# Patient Record
Sex: Male | Born: 1965 | Race: White | Hispanic: No | Marital: Single | Smoking: Current every day smoker
Health system: Southern US, Community
[De-identification: ages and names within clinical notes are randomized; demographics above are authoritative.]

## PROBLEM LIST (undated history)

## (undated) HISTORY — PX: APPENDECTOMY: SHX54

## (undated) HISTORY — PX: OTHER SURGICAL HISTORY: SHX169

## (undated) HISTORY — PX: KNEE SURGERY: SHX244

---

## 2017-12-18 ENCOUNTER — Emergency Department (HOSPITAL_COMMUNITY)
Admission: EM | Admit: 2017-12-18 | Discharge: 2017-12-18 | Disposition: A | Discharge status: Home / Self Care | Payer: Worker's Compensation | Attending: Emergency Medicine | Admitting: Emergency Medicine

## 2017-12-18 ENCOUNTER — Encounter (HOSPITAL_COMMUNITY): Payer: Self-pay

## 2017-12-18 ENCOUNTER — Emergency Department (HOSPITAL_COMMUNITY): Payer: Worker's Compensation

## 2017-12-18 ENCOUNTER — Other Ambulatory Visit: Payer: Self-pay

## 2017-12-18 DIAGNOSIS — Y990 Civilian activity done for income or pay: Secondary | ICD-10-CM | POA: Diagnosis not present

## 2017-12-18 DIAGNOSIS — F17210 Nicotine dependence, cigarettes, uncomplicated: Secondary | ICD-10-CM | POA: Diagnosis not present

## 2017-12-18 DIAGNOSIS — S91342A Puncture wound with foreign body, left foot, initial encounter: Secondary | ICD-10-CM | POA: Insufficient documentation

## 2017-12-18 DIAGNOSIS — S91332A Puncture wound without foreign body, left foot, initial encounter: Secondary | ICD-10-CM

## 2017-12-18 DIAGNOSIS — W450XXA Nail entering through skin, initial encounter: Secondary | ICD-10-CM | POA: Insufficient documentation

## 2017-12-18 DIAGNOSIS — Y93H3 Activity, building and construction: Secondary | ICD-10-CM | POA: Diagnosis not present

## 2017-12-18 DIAGNOSIS — Y9261 Building [any] under construction as the place of occurrence of the external cause: Secondary | ICD-10-CM | POA: Diagnosis not present

## 2017-12-18 DIAGNOSIS — S99922A Unspecified injury of left foot, initial encounter: Secondary | ICD-10-CM | POA: Diagnosis present

## 2017-12-18 DIAGNOSIS — S90852A Superficial foreign body, left foot, initial encounter: Secondary | ICD-10-CM

## 2017-12-18 LAB — BASIC METABOLIC PANEL
ANION GAP: 12 (ref 5–15)
BUN: 22 mg/dL — ABNORMAL HIGH (ref 6–20)
CALCIUM: 9.6 mg/dL (ref 8.9–10.3)
CHLORIDE: 103 mmol/L (ref 98–111)
CO2: 24 mmol/L (ref 22–32)
CREATININE: 0.98 mg/dL (ref 0.61–1.24)
GFR calc Af Amer: >60 mL/min (ref >60)
GLUCOSE: 88 mg/dL (ref 70–99)
POTASSIUM: 3.5 mmol/L (ref 3.5–5.1)
Sodium: 139 mmol/L (ref 135–145)

## 2017-12-18 LAB — CBC WITH DIFFERENTIAL/PLATELET
BASOS ABS: 0 10*3/uL (ref 0.0–0.1)
BASOS PCT: 0 %
EOS ABS: 0.3 10*3/uL (ref 0.0–0.7)
Eosinophils Relative: 3 %
HEMATOCRIT: 42.6 % (ref 39.0–52.0)
Hemoglobin: 14.7 g/dL (ref 13.0–17.0)
Lymphocytes Relative: 26 %
Lymphs Abs: 2.5 10*3/uL (ref 0.7–4.0)
MCH: 31.9 pg (ref 26.0–34.0)
MCHC: 34.5 g/dL (ref 30.0–36.0)
MCV: 92.4 fL (ref 78.0–100.0)
MONO ABS: 1.1 10*3/uL — AB (ref 0.1–1.0)
Monocytes Relative: 11 %
NEUTROS ABS: 6 10*3/uL (ref 1.7–7.7)
Neutrophils Relative %: 60 %
PLATELETS: 183 10*3/uL (ref 150–400)
RBC: 4.61 MIL/uL (ref 4.22–5.81)
RDW: 12.8 % (ref 11.5–15.5)
WBC: 10 10*3/uL (ref 4.0–10.5)

## 2017-12-18 MED ORDER — HYDROMORPHONE HCL 1 MG/ML IJ SOLN
1.0000 mg | Freq: Once | INTRAMUSCULAR | Status: AC
  Administered 2017-12-18: 1 mg via INTRAVENOUS
  Filled 2017-12-18: qty 1

## 2017-12-18 MED ORDER — MORPHINE SULFATE (PF) 4 MG/ML IV SOLN
4.0000 mg | Freq: Once | INTRAVENOUS | Status: AC
Start: 2017-12-18 — End: 2017-12-18
  Administered 2017-12-18: 4 mg via INTRAVENOUS
  Filled 2017-12-18: qty 1

## 2017-12-18 MED ORDER — HYDROCODONE-ACETAMINOPHEN 5-325 MG PO TABS: 1.0000 | ORAL_TABLET | Freq: Four times a day (QID) | ORAL | 0 refills | Status: AC | PRN

## 2017-12-18 MED ORDER — CIPROFLOXACIN HCL 500 MG PO TABS: 500.0000 mg | ORAL_TABLET | Freq: Two times a day (BID) | ORAL | 0 refills | Status: AC

## 2017-12-18 MED ORDER — SODIUM CHLORIDE 0.9 % IV SOLN
2.0000 g | Freq: Once | INTRAVENOUS | Status: AC
  Administered 2017-12-18: 2 g via INTRAVENOUS
  Filled 2017-12-18: qty 2

## 2017-12-18 MED ORDER — LIDOCAINE-EPINEPHRINE (PF) 2 %-1:200000 IJ SOLN
10.0000 mL | Freq: Once | INTRAMUSCULAR | Status: AC
  Administered 2017-12-18: 10 mL
  Filled 2017-12-18: qty 20

## 2017-12-18 MED ORDER — AMOXICILLIN-POT CLAVULANATE 875-125 MG PO TABS: 1.0000 | ORAL_TABLET | Freq: Two times a day (BID) | ORAL | 0 refills | Status: AC

## 2017-12-18 MED ORDER — HYDROCODONE-ACETAMINOPHEN 5-325 MG PO TABS
1.0000 | ORAL_TABLET | Freq: Once | ORAL | Status: AC
  Administered 2017-12-18: 1 via ORAL
  Filled 2017-12-18: qty 1

## 2017-12-18 MED ORDER — FENTANYL CITRATE (PF) 100 MCG/2ML IJ SOLN
50.0000 ug | Freq: Once | INTRAMUSCULAR | Status: AC
Start: 2017-12-18 — End: 2017-12-18
  Administered 2017-12-18: 50 ug via INTRAMUSCULAR
  Filled 2017-12-18: qty 2

## 2017-12-18 MED ORDER — LIDOCAINE HCL (PF) 1 % IJ SOLN: 10.0000 mL | Freq: Once | INTRAMUSCULAR | Status: DC

## 2017-12-18 NOTE — ED Triage Notes (Signed)
Patient has a 3 inch nail in the right foot from a nail gun. Patient went to a [facility] and had x-rays completed. Patient brought CD's with him. No bleeding at this time. Pain 10/10.

## 2017-12-18 NOTE — Discharge Instructions (Addendum)
You were seen here today for a nail in your left foot. This was removed in the department I am starting on 2 different antibiotics. Please take all of your antibiotics until finished!   You may develop abdominal discomfort or diarrhea from the antibiotic.  You may help offset this with probiotics which you can buy or get in yogurt. Do not eat or take the probiotics until 2 hours after your antibiotic. Do not take your medicine if develop an itchy rash, swelling in your mouth or lips, or difficulty breathing. Please note that one of the antibiotics I am starting you on, ciprofloxacin, can lead to increased risk of tendon injury/rupture.  Please avoid high impact activities while taking this medication for several weeks after finishing the medication.  Please take Norco as needed for pain.  Please note this medication is a narcotic.  Please do not drive while taking his medication.  He will have to follow-up with orthopedics for further refills of his medication.  He may become constipated from this medication.  He can try stool softeners and MiraLAX over-the-counter to help with this.  Please remain non-weightbearing by using crutches until you are able to follow up with orthopedics. I would like you to call tomorrow morning to his office to schedule a follow up appointment.   Your postop x-ray showed: Complete removal of metallic nail from the left foot. Probable focal cortical destruction along the lateral aspect of the base of the first proximal phalanx and slight lucent defect in the head of the second metatarsal related to the course of the nail.  Return for re-evaluation if you develop You have a fever. You notice a bad smell coming from your wound or your dressing. You have increased redness, swelling, or pain at the site of your wound. You have fluid, blood, or pus coming from your wound. You notice a change in the color of your skin near your wound. You need to change the dressing frequently  due to fluid, blood, or pus draining from your wound. You develop a new rash. You develop numbness around or distal to your wound. You develop severe swelling around your wound. Your pain suddenly increases and is severe. You develop painful skin lumps. You have a red streak going away from your wound. The wound is on your hand or foot and you cannot properly move a finger or toe. The wound is on your hand or foot and you notice that your fingers or toes look pale or bluish.

## 2017-12-18 NOTE — ED Provider Notes (Signed)
[facility]-EMERGENCY DEPT Provider Note   CSN: [id number] Arrival date & time: 12/18/17  1615     History   Chief Complaint Chief Complaint  Patient presents with  . nail in the foot    HPI Dylan Wheeler is a 52 y.o. male with no significant past medical history who presents emergency department today for injury to the left foot.  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  [facility] and was told he had a "chipped bone".  His sock and shoe removed on arrival and it appears that the nail enters at an angle through the first MCP and exits on the posterior forefoot adjacent to the fourth digit as seen in pictures below.  He notes he does have some numbing of the first digit.  He notes movement of the toes because of extreme pain. He rates his pain as 10/10  & describes as constant throbbing pain. He denies any pain medication prior to arrival.  His last p.o. intake was approximately 12 PM today.  His last tetanus shot was 2 years ago.  HPI  History reviewed. No pertinent past medical history.  There are no active problems to display for this patient.   Past Surgical History:  Procedure Laterality Date  . APPENDECTOMY    . cystectomy    . KNEE SURGERY          Home Medications    Prior to Admission medications   Not on File    Family History History reviewed. No pertinent family history.  Social History Social History   Tobacco Use  . Smoking status: Current Every Day Smoker    Packs/day: 1.00    Types: Cigarettes  . Smokeless tobacco: Never Used  Substance Use Topics  . Alcohol use: Yes    Comment: occasionally  . Drug use: Never     Allergies   Patient has no known allergies.   Review of Systems Review of Systems  All other systems reviewed and are negative.    Physical  Exam Updated Vital Signs BP (!) 138/94   Pulse 67   Temp 97.8 F (36.6 C)   Resp 16   Ht 6\' 5"  (1.956 m)   Wt 102.1 kg (225 lb)   SpO2 100%   BMI 26.68 kg/m   Physical Exam  Constitutional: He appears well-developed and well-nourished.  HENT:  Head: Normocephalic and atraumatic.  Right Ear: External ear normal.  Left Ear: External ear normal.  Nose: Nose normal.  Mouth/Throat: Uvula is midline, oropharynx is clear and moist and mucous membranes are normal. No tonsillar exudate.  Eyes: Pupils are equal, round, and reactive to light. Right eye exhibits no discharge. Left eye exhibits no discharge. No scleral icterus.  Neck: Trachea normal. Neck supple. No spinous process tenderness present. No neck rigidity. Normal range of motion present.  Cardiovascular: Normal rate, regular rhythm and intact distal pulses.  No murmur heard. Pulses:      Radial pulses are 2+ on the right side, and 2+ on the left side.       Dorsalis pedis pulses are 2+ on the right side, and 2+ on the left side.       Posterior tibial pulses are 2+ on the right side, and 2+ on the left side.  Pulmonary/Chest: Effort  normal and breath sounds normal. He exhibits no tenderness.  Abdominal: Soft. Bowel sounds are normal. There is no tenderness. There is no rebound and no guarding.  Musculoskeletal: He exhibits no edema.       Left ankle: Normal. Achilles tendon normal.  There is noted to be an entry wound of the nail through the 1st MCP at an angle with exit wound on the forefoot of the dorsal aspect at the level of the 3rd and 4th digits as seen in the picture below. There is decreased rom of the 1st - 4th digits 2/2 pain. Cap refill 2 seconds.   Lymphadenopathy:    He has no cervical adenopathy.  Neurological: He is alert.  Decreased sensation to light touch of the 1st digit of the left foot  Skin: Skin is warm and dry. No rash noted. He is not diaphoretic.  Psychiatric: He has a normal mood and affect.  Nursing  note and vitals reviewed.        ED Treatments / Results  Labs (all labs ordered are listed, but only abnormal results are displayed) Labs Reviewed  BASIC METABOLIC PANEL - Abnormal; Notable for the following components:      Result Value   BUN 22 (*)    All other components within normal limits  CBC WITH DIFFERENTIAL/PLATELET - Abnormal; Notable for the following components:   Monocytes Absolute 1.1 (*)    All other components within normal limits    EKG None  Radiology Dg Foot Complete Left  Result Date: 12/18/2017 CLINICAL DATA:  Status post removal male from left foot. EXAM: LEFT FOOT - COMPLETE 3+ VIEW COMPARISON:  None. FINDINGS: The metallic nail has been completely removed. There is mild cortical destruction along the lateral aspect of the base of the first proximal phalanx where the foreign body likely traversed. There also is a slight lucent defect in the head of the second metatarsal also potentially representing partial bony injury or indentation. The metatarsal cortex in this region does appear to be completely intact, however with no obvious break. No other bony abnormalities. IMPRESSION: Complete removal of metallic nail from the left foot. Probable focal cortical destruction along the lateral aspect of the base of the first proximal phalanx and slight lucent defect in the head of the second metatarsal related to the course of the nail. Electronically Signed   By: Irish Lack M.D.   On: 12/18/2017 21:05   Dg Foot Complete Left  Result Date: 12/18/2017 CLINICAL DATA:  Nail gun injury EXAM: LEFT FOOT - COMPLETE 3+ VIEW COMPARISON:  None. FINDINGS: Metal nasal is present overlying the first second and third and fourth metatarsophalangeal joints. This goes through the first proximal phalanx and probably into the soft tissues inferior to the second third and fourth MTP joint. No displaced fracture fragments. Mild bunion and degenerative change first metatarsophalangeal  joint. IMPRESSION: Metal nail approximately 6 cm long appears to be passing through the proximal first phalanx and extending into the soft tissues below the second third and fourth MTP. Electronically Signed   By: Marlan Palau M.D.   On: 12/18/2017 18:37   Urgent care films        Procedures .Foreign Body Removal Date/Time: 12/18/2017 9:11 PM Performed by: Jacinto Halim, PA-C Authorized by: Jacinto Halim, PA-C  Consent: Verbal consent obtained. Risks and benefits: risks, benefits and alternatives were discussed Consent given by: patient Patient understanding: patient states understanding of the procedure being performed Patient consent: the patient's understanding  of the procedure matches consent given Relevant documents: relevant documents present and verified Test results: test results available and properly labeled Site marked: the operative site was marked Imaging studies: imaging studies available Patient identity confirmed: verbally with patient and arm band Time out: Immediately prior to procedure a "time out" was called to verify the correct patient, procedure, equipment, support staff and site/side marked as required. Intake: left foot. Anesthesia: local infiltration  Anesthesia: Local Anesthetic: lidocaine 2% with epinephrine Anesthetic total: 15 mL  Sedation: Patient sedated: no  Patient restrained: no Complexity: complex 1 objects recovered. Objects recovered: nail Post-procedure assessment: foreign body removed Patient tolerance: Patient tolerated the procedure well with no immediate complications   (including critical care time) SPLINT APPLICATION Date/Time: 10:03 PM Authorized by: Jacinto HalimMichael M Starasia Sinko Consent: Verbal consent obtained. Risks and benefits: risks, benefits and alternatives were discussed Consent given by: patient Splint applied by: orthopedic technician Location details: left foot Splint type: post op shoe Supplies used: post op  shoe Post-procedure: The splinted body part was neurovascularly unchanged following the procedure. Patient tolerance: Patient tolerated the procedure well with no immediate complications.    Medications Ordered in ED Medications  fentaNYL (SUBLIMAZE) injection 50 mcg (50 mcg Intramuscular Given 12/18/17 1730)  ceFEPIme (MAXIPIME) 2 g in sodium chloride 0.9 % 100 mL IVPB (0 g Intravenous Stopped 12/18/17 1847)  morphine 4 MG/ML injection 4 mg (4 mg Intravenous Given 12/18/17 1803)  HYDROmorphone (DILAUDID) injection 1 mg (1 mg Intravenous Given 12/18/17 1853)  lidocaine-EPINEPHrine (XYLOCAINE W/EPI) 2 %-1:200000 (PF) injection 10 mL (10 mLs Other Given 12/18/17 1930)  HYDROcodone-acetaminophen (NORCO/VICODIN) 5-325 MG per tablet 1 tablet (1 tablet Oral Given 12/18/17 2147)     Initial Impression / Assessment and Plan / ED Course  I have reviewed the triage vital signs and the nursing notes.  Pertinent labs & imaging results that were available during my care of the patient were reviewed by me and considered in my medical decision making (see chart for details).  Clinical Course as of Dec 18 2198  Tue Dec 18, 2017  [id number] 52 year old male had a nail go through his shoe and sock into his foot from a nail gun.  He was seen at urgent care referred him here.  His x-rays are likely some cortical disruption of the bone and his first and second metatarsal.  Ortho was consulted and they recommended removing the foreign body placing the patient on antibiotics and following up.  Patient had a foreign body removed under local after some IV pain medicine and repeat x-rays look like it is been removed intact.  He has been placed in a walking shoe and antibiotics and follow-up with a good understanding for what to watch out for.   [MB]    Clinical Course User Index [MB] Terrilee FilesButler, Joshuan Bolander C, MD   52 year old male presenting with foreign body, nail, in the left foot as above.  Patient was seen in urgent care and  referred here.  X-rays as above.  Case was discussed with orthopedics, Dr. Roda ShuttersXu, who recommended removal in the department.  Patient's case was discussed with Kenard Gowerrew form pharmacy who recommended cefepime in the department and discharged on amoxicillin and ciprofloxacin.  Foreign body was removed by myself and thorough irrigation with 2.5 L of pressure irrigation was performed afterwards.  The wound was bandaged with Xeroform dressing as well as a bulky dressing.  Postop x-rays show cortical destruction along the lateral aspect of the first and second metatarsals.  Patient  will be placed in postop shoe and given crutches.  He is to remain nonweightbearing until follow-up with orthopedics.  Patient was advised he is being given ciprofloxacin in the status post risk for tendon injury.  He was given a short course of pain medication.  The patient was reviewed in the [place] controlled substance database and no discrepancies were found.  Patient was advised strict return precautions.  Patient states understanding and pain is currently controlled.  He appears safe for discharge.  Final Clinical Impressions(s) / ED Diagnoses   Final diagnoses:  Foreign body in left foot, initial encounter  Puncture wound of left foot, initial encounter    ED Discharge Orders        Ordered    ciprofloxacin (CIPRO) 500 MG tablet  2 times daily     12/18/17 2129    amoxicillin-clavulanate (AUGMENTIN) 875-125 MG tablet  Every 12 hours     12/18/17 2129    HYDROcodone-acetaminophen (NORCO) 5-325 MG tablet  Every 6 hours PRN     12/18/17 2129       Princella Pellegrini 12/18/17 2204    Terrilee Files, MD 12/19/17 1438

## 2017-12-25 ENCOUNTER — Ambulatory Visit (INDEPENDENT_AMBULATORY_CARE_PROVIDER_SITE_OTHER): Payer: Self-pay | Admitting: Orthopaedic Surgery

## 2017-12-25 DIAGNOSIS — S90852A Superficial foreign body, left foot, initial encounter: Secondary | ICD-10-CM | POA: Insufficient documentation

## 2017-12-25 MED ORDER — AMOXICILLIN-POT CLAVULANATE 875-125 MG PO TABS: 1.0000 | ORAL_TABLET | Freq: Two times a day (BID) | ORAL | 0 refills | Status: AC

## 2017-12-25 MED ORDER — CIPROFLOXACIN HCL 500 MG PO TABS: 500.0000 mg | ORAL_TABLET | Freq: Two times a day (BID) | ORAL | 0 refills | Status: AC

## 2017-12-25 NOTE — Progress Notes (Signed)
Office Visit Note   Patient: Dylan Wheeler           Date of Birth: 1965/08/07           MRN: [id number] Visit Date: 12/25/2017              Requested by: No referring provider defined for this encounter. PCP: Patient, No Pcp Per   Assessment & Plan: Visit Diagnoses:  1. Foreign body in left foot, initial encounter     Plan: Impression is status post left nail gun injury to the left foot status post removal by the ED.  Clinically speaking he seems to be doing okay and does not exhibit any signs of deep infection.  I am going to extend his antibiotics for another week.  Recheck at that time.  Patient instructed to return sooner if he develops any signs or symptoms of deep infection.  Follow-Up Instructions: Return in about 1 week (around 01/01/2018).   Orders:  No orders of the defined types were placed in this encounter.  Meds ordered this encounter  Medications  . ciprofloxacin (CIPRO) 500 MG tablet    Sig: Take 1 tablet (500 mg total) by mouth 2 (two) times daily.    Dispense:  14 tablet    Refill:  0  . amoxicillin-clavulanate (AUGMENTIN) 875-125 MG tablet    Sig: Take 1 tablet by mouth 2 (two) times daily.    Dispense:  14 tablet    Refill:  0      Procedures: No procedures performed   Clinical Data: No additional findings.   Subjective: Chief Complaint  Patient presents with  . Left Foot - Wound Check    Puncture wound  12/18/17    Dylan Wheeler is a healthy 52 year old gentleman who has sustained a nail gun injury to his left foot a week ago.  This was removed in the ED.  He has been on a week of Augmentin and Cipro.  He denies any constitutional symptoms.  Denies any significant pain.   Review of Systems  Constitutional: Negative.   All other systems reviewed and are negative.    Objective: Vital Signs: There were no vitals taken for this visit.  Physical Exam  Constitutional: He is oriented to person, place, and time. He appears well-developed and  well-nourished.  HENT:  Head: Normocephalic and atraumatic.  Eyes: Pupils are equal, round, and reactive to light.  Neck: Neck supple.  Pulmonary/Chest: Effort normal.  Abdominal: Soft.  Musculoskeletal: Normal range of motion.  Neurological: He is alert and oriented to person, place, and time.  Skin: Skin is warm.  Psychiatric: He has a normal mood and affect. His behavior is normal. Judgment and thought content normal.  Nursing note and vitals reviewed.   Ortho Exam Left foot exam shows mild to moderate swelling without any evidence of ascending cellulitis.  There is some mild tenderness palpation around the foot near the puncture sites.  There is no drainage.  No neurovascular compromise. Specialty Comments:  No specialty comments available.  Imaging: No results found.   PMFS History: Patient Active Problem List   Diagnosis Date Noted  . Foreign body in left foot 12/25/2017   No past medical history on file.  No family history on file.  Past Surgical History:  Procedure Laterality Date  . APPENDECTOMY    . cystectomy    . KNEE SURGERY     Social History   Occupational History  . Not on file  Tobacco Use  .  Smoking status: Current Every Day Smoker    Packs/day: 1.00    Types: Cigarettes  . Smokeless tobacco: Never Used  Substance and Sexual Activity  . Alcohol use: Yes    Comment: occasionally  . Drug use: Never  . Sexual activity: Not on file

## 2018-01-01 ENCOUNTER — Ambulatory Visit (INDEPENDENT_AMBULATORY_CARE_PROVIDER_SITE_OTHER): Payer: Self-pay | Admitting: Orthopaedic Surgery

## 2018-01-15 ENCOUNTER — Ambulatory Visit (INDEPENDENT_AMBULATORY_CARE_PROVIDER_SITE_OTHER): Payer: Self-pay | Admitting: Orthopaedic Surgery

## 2018-01-15 ENCOUNTER — Encounter (INDEPENDENT_AMBULATORY_CARE_PROVIDER_SITE_OTHER): Payer: Self-pay | Admitting: Orthopaedic Surgery

## 2018-01-15 ENCOUNTER — Ambulatory Visit (INDEPENDENT_AMBULATORY_CARE_PROVIDER_SITE_OTHER): Payer: Self-pay

## 2018-01-15 DIAGNOSIS — M79672 Pain in left foot: Secondary | ICD-10-CM

## 2018-01-15 NOTE — Progress Notes (Signed)
   Office Visit Note   Patient: Dylan Wheeler           Date of Birth: 27-Feb-1966           MRN: [id number] Visit Date: 01/15/2018              Requested by: No referring provider defined for this encounter. PCP: Dylan Wheeler   Assessment & Plan: Visit Diagnoses:  1. Pain in left foot     Plan: Impression is left foot healed wound but concerning for deep infection given the fact that patient did not take the antibiotics that we prescribed him.  The patient has been working and continues to have significant swelling to the left foot.  Will order MRI asap to assess for abscess.   Follow-Up Instructions: Return in about 4 weeks (around 02/12/2018).   Orders:  Orders Placed This Encounter  Procedures  . XR Foot Complete Left  . MR FOOT LEFT W CONTRAST  . Arthrogram   No orders of the defined types were placed in this encounter.     Procedures: No procedures performed   Clinical Data: No additional findings.   Subjective: Chief Complaint  Patient presents with  . Left Foot - Pain    HPI patient is a pleasant 52 year old gentleman who presents to our clinic today for follow-up of his left foot.  He sustained a nail gun injury while at work on 12/18/2017.  This was removed in the ED.  He was initially on Augmentin and Cipro prior to his follow-up with us.  He was seen in our office on 12/25/2017.  We extended his antibiotics for an additional week, but the patient refused to take these.  He has been working in [employer] over the past few weeks but noticed an increase in pain, swelling and deformity to the second and third toes at the end of last week.  No new injury.  Denies any constitutional symptoms.  Review of Systems as detailed in HPI.  All others reviewed and are negative.   Objective: Vital Signs: There were no vitals taken for this visit.  Physical Exam well-developed well-nourished gentleman in no acute distress.  Alert and oriented x3.  Ortho Exam  examination of the left foot reveals marked swelling throughout.  Marked tenderness to the second and third metatarsal heads.  He does have slightly limited range of motion.  Slight erythema to the dorsum of the foot.   Specialty Comments:  No specialty comments available.  Imaging: Xr Foot Complete Left  Result Date: 01/15/2018 No acute or structural abnormalities    PMFS History: Patient Active Problem List   Diagnosis Date Noted  . Pain in left foot 01/15/2018  . Foreign body in left foot 12/25/2017   History reviewed. No pertinent past medical history.  History reviewed. No pertinent family history.  Past Surgical History:  Procedure Laterality Date  . APPENDECTOMY    . cystectomy    . KNEE SURGERY     Social History   Occupational History  . Not on file  Tobacco Use  . Smoking status: Current Every Day Smoker    Packs/day: 1.00    Types: Cigarettes  . Smokeless tobacco: Never Used  Substance and Sexual Activity  . Alcohol use: Yes    Comment: occasionally  . Drug use: Never  . Sexual activity: Not on file

## 2018-01-28 ENCOUNTER — Telehealth (INDEPENDENT_AMBULATORY_CARE_PROVIDER_SITE_OTHER): Payer: Self-pay | Admitting: Orthopaedic Surgery

## 2018-01-28 NOTE — Telephone Encounter (Signed)
Ok to do this? Not sure why they want both.

## 2018-01-28 NOTE — Telephone Encounter (Signed)
yes

## 2018-01-28 NOTE — Telephone Encounter (Signed)
Rhonda from [facility] called stating that she needs the Epic order for the patient's MRI to be change to with and without contrast.  CB#[phone].  Thank you.

## 2018-01-29 NOTE — Telephone Encounter (Signed)
DONE ALREADY

## 2018-01-30 ENCOUNTER — Ambulatory Visit
Admission: RE | Admit: 2018-01-30 | Discharge: 2018-01-30 | Disposition: A | Discharge status: Home / Self Care | Payer: No Typology Code available for payment source | Source: Ambulatory Visit | Attending: Orthopaedic Surgery | Admitting: Orthopaedic Surgery

## 2018-01-30 ENCOUNTER — Other Ambulatory Visit: Payer: Self-pay

## 2018-01-30 DIAGNOSIS — M79672 Pain in left foot: Secondary | ICD-10-CM

## 2018-01-30 MED ORDER — GADOBENATE DIMEGLUMINE 529 MG/ML IV SOLN
20.0000 mL | Freq: Once | INTRAVENOUS | Status: AC | PRN
  Administered 2018-01-30: 20 mL via INTRAVENOUS

## 2018-01-31 NOTE — Progress Notes (Signed)
MRI is negative for infection.  Looks like it's swelling and edema from fracture and not elevating his foot.

## 2018-02-12 ENCOUNTER — Ambulatory Visit (INDEPENDENT_AMBULATORY_CARE_PROVIDER_SITE_OTHER): Payer: Self-pay | Admitting: Orthopaedic Surgery

## 2018-02-12 DIAGNOSIS — M79672 Pain in left foot: Secondary | ICD-10-CM

## 2018-02-12 NOTE — Progress Notes (Signed)
   Office Visit Note   Patient: Dylan Wheeler           Date of Birth: 11/11/1965           MRN: [id number] Visit Date: 02/12/2018              Requested by: No referring provider defined for this encounter. PCP: Patient, No Pcp Per   Assessment & Plan: Visit Diagnoses:  1. Pain in left foot     Plan: MRI is negative for infection.  He does have healing fractures which does explain his continued swelling and discomfort especially since he is on his feet all day.  These findings are overall reassuring.  He is to increase activity as tolerated.  Rest ice and elevate as needed.  Follow-up as needed.  Follow-Up Instructions: Return if symptoms worsen or fail to improve.   Orders:  No orders of the defined types were placed in this encounter.  No orders of the defined types were placed in this encounter.     Procedures: No procedures performed   Clinical Data: No additional findings.   Subjective: Chief Complaint  Patient presents with  . Left Foot - Pain    Khadar follows up today for MRI review.  He denies any constitutional symptoms.  He still has swelling but this is significantly improved compared to our last visit.   Review of Systems   Objective: Vital Signs: There were no vitals taken for this visit.  Physical Exam  Ortho Exam Left foot exam shows no cellulitis or evidence of infection.  No neurovascular compromise.  He does have some persistent mild to moderate swelling which appears to be symmetric to the other side. Specialty Comments:  No specialty comments available.  Imaging: No results found.   PMFS History: Patient Active Problem List   Diagnosis Date Noted  . Pain in left foot 01/15/2018  . Foreign body in left foot 12/25/2017   No past medical history on file.  No family history on file.  Past Surgical History:  Procedure Laterality Date  . APPENDECTOMY    . cystectomy    . KNEE SURGERY     Social History   Occupational History    . Not on file  Tobacco Use  . Smoking status: Current Every Day Smoker    Packs/day: 1.00    Types: Cigarettes  . Smokeless tobacco: Never Used  Substance and Sexual Activity  . Alcohol use: Yes    Comment: occasionally  . Drug use: Never  . Sexual activity: Not on file

## 2020-03-30 IMAGING — CR DG FOOT COMPLETE 3+V*L*
3 series · 3 of 3 positions shown · non-contrast
Comparison: None.

CLINICAL DATA: Status post removal male from left foot.

EXAM:
LEFT FOOT - COMPLETE 3+ VIEW

[x foot ap left]
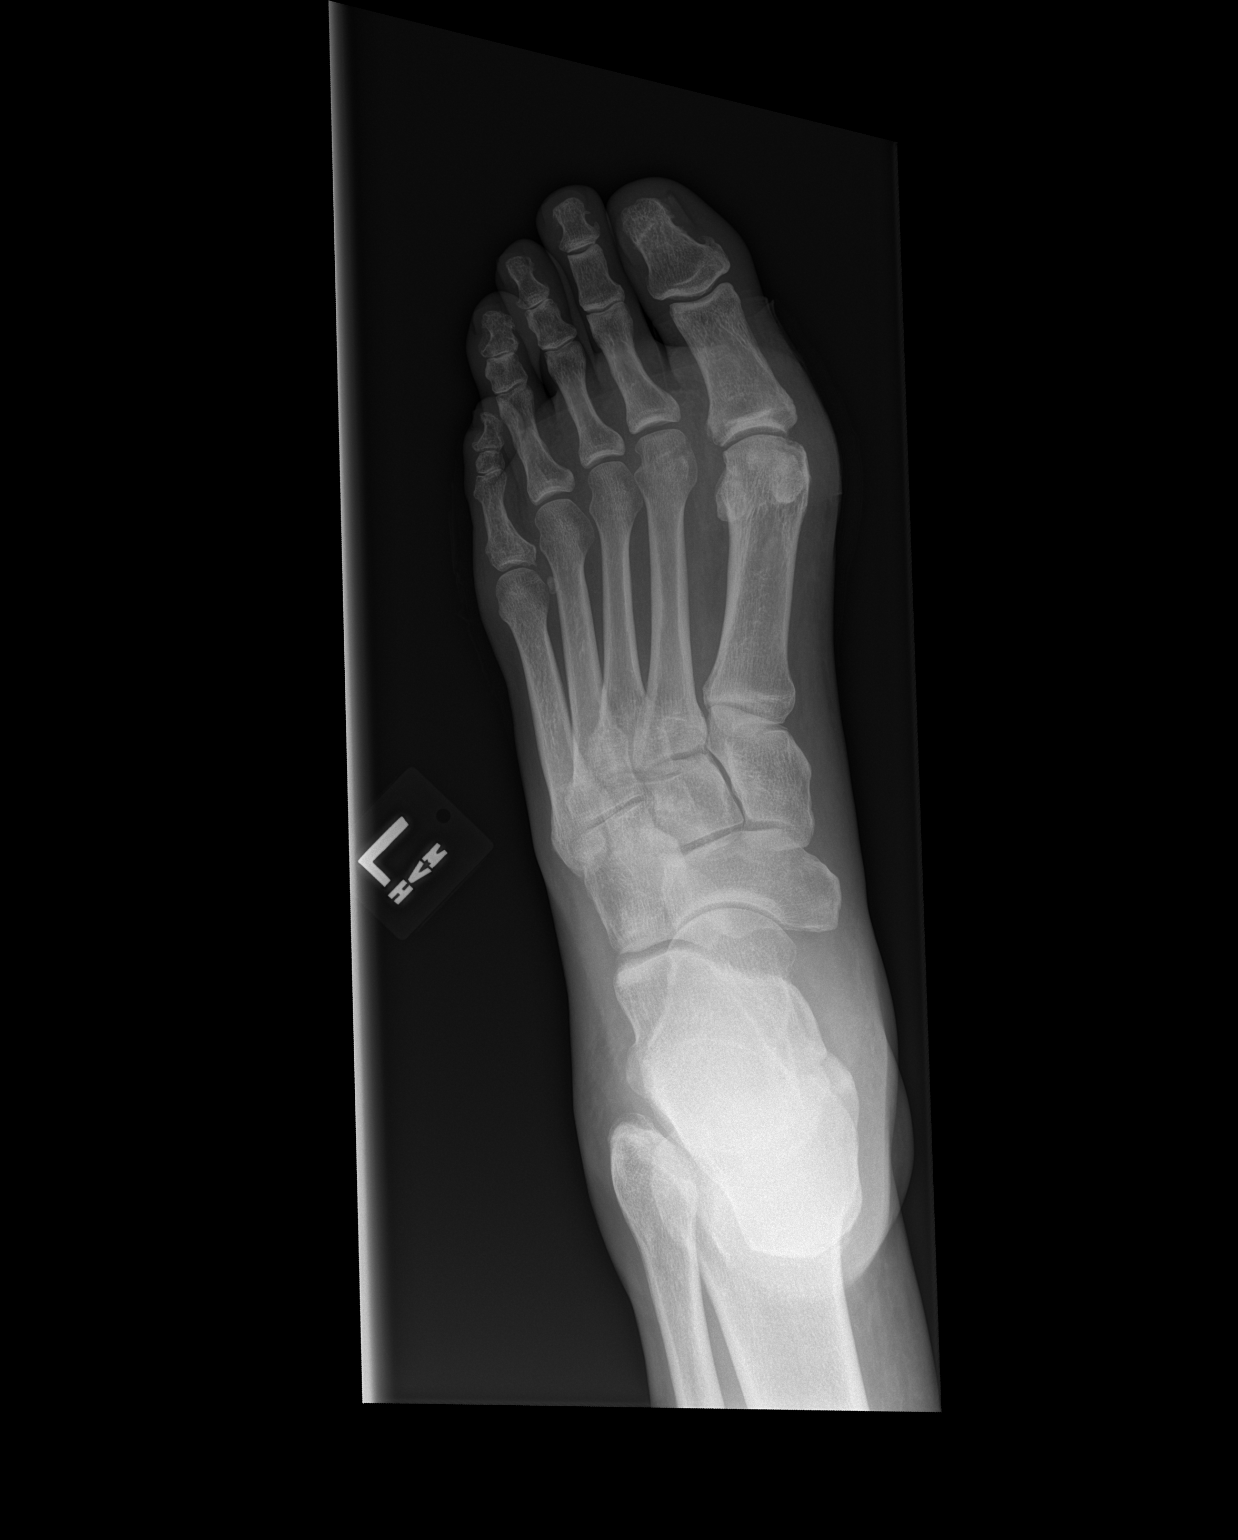

[x foot obl left]
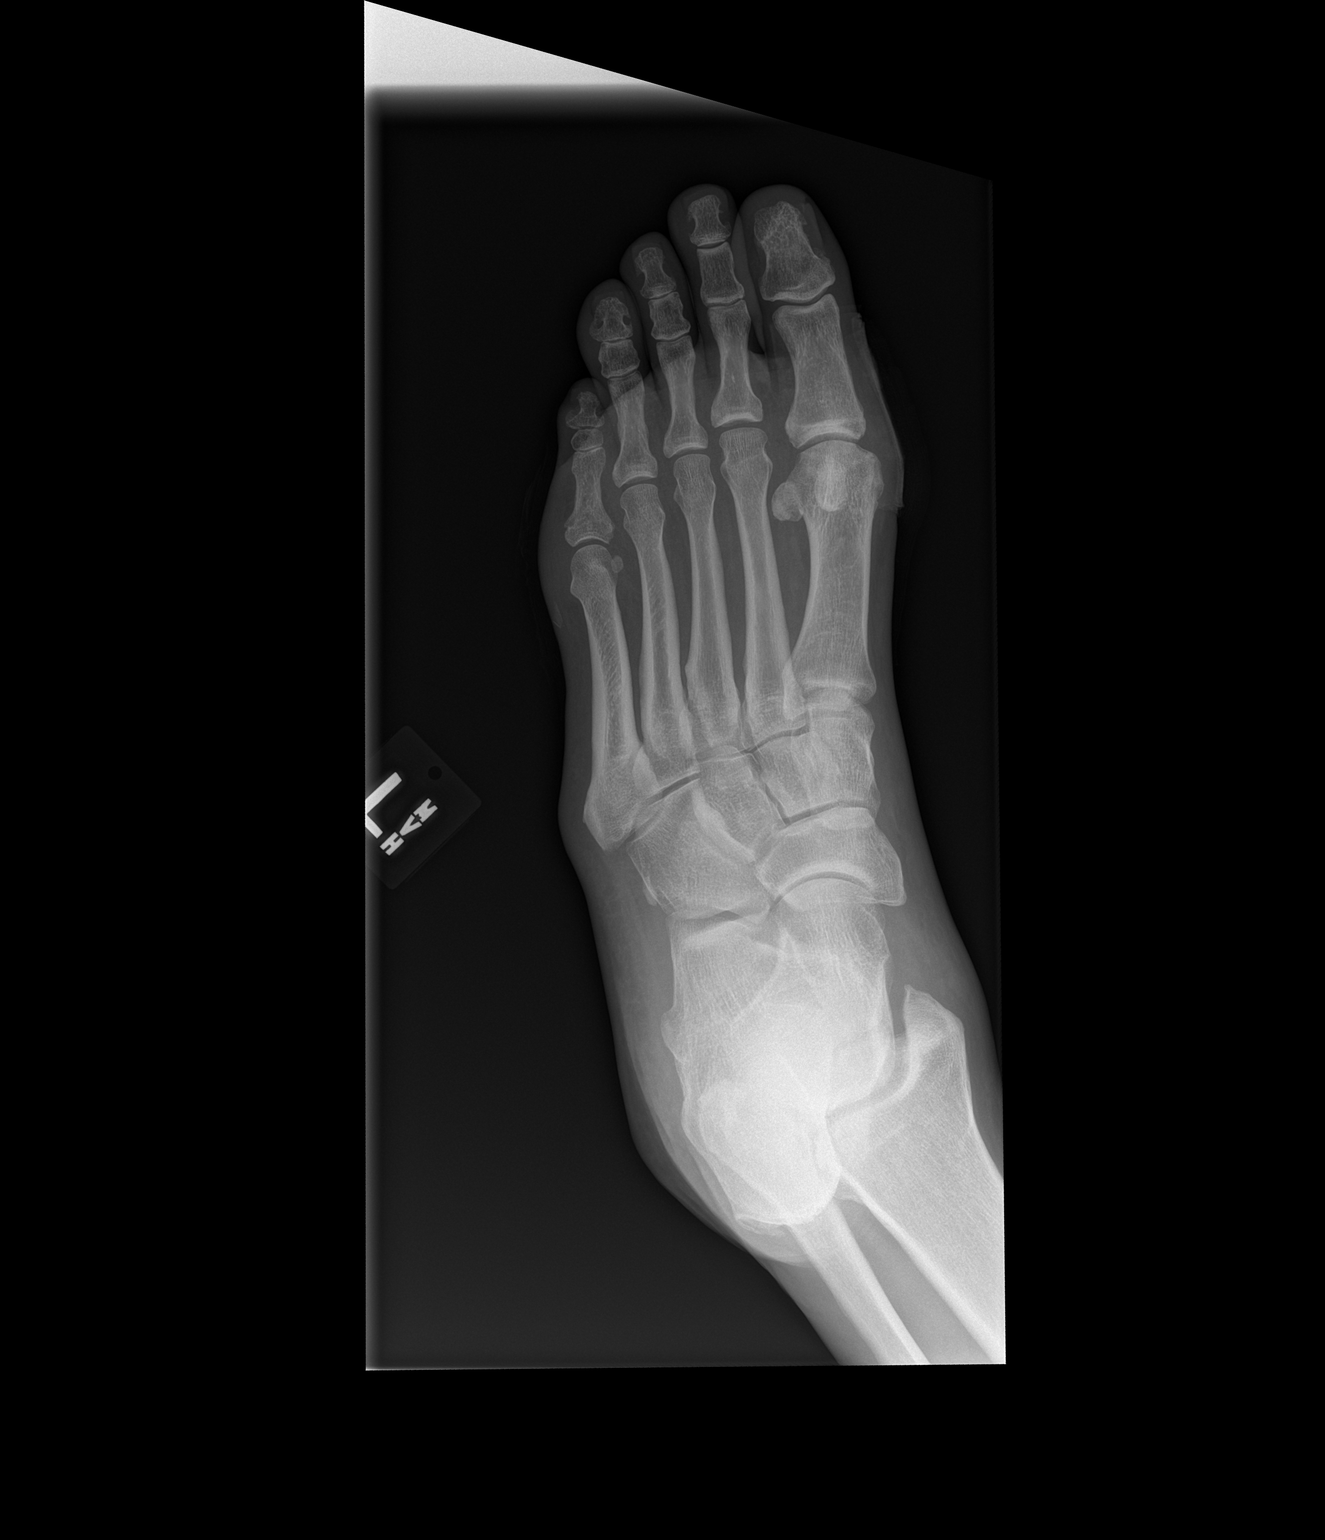

[x foot lat left]
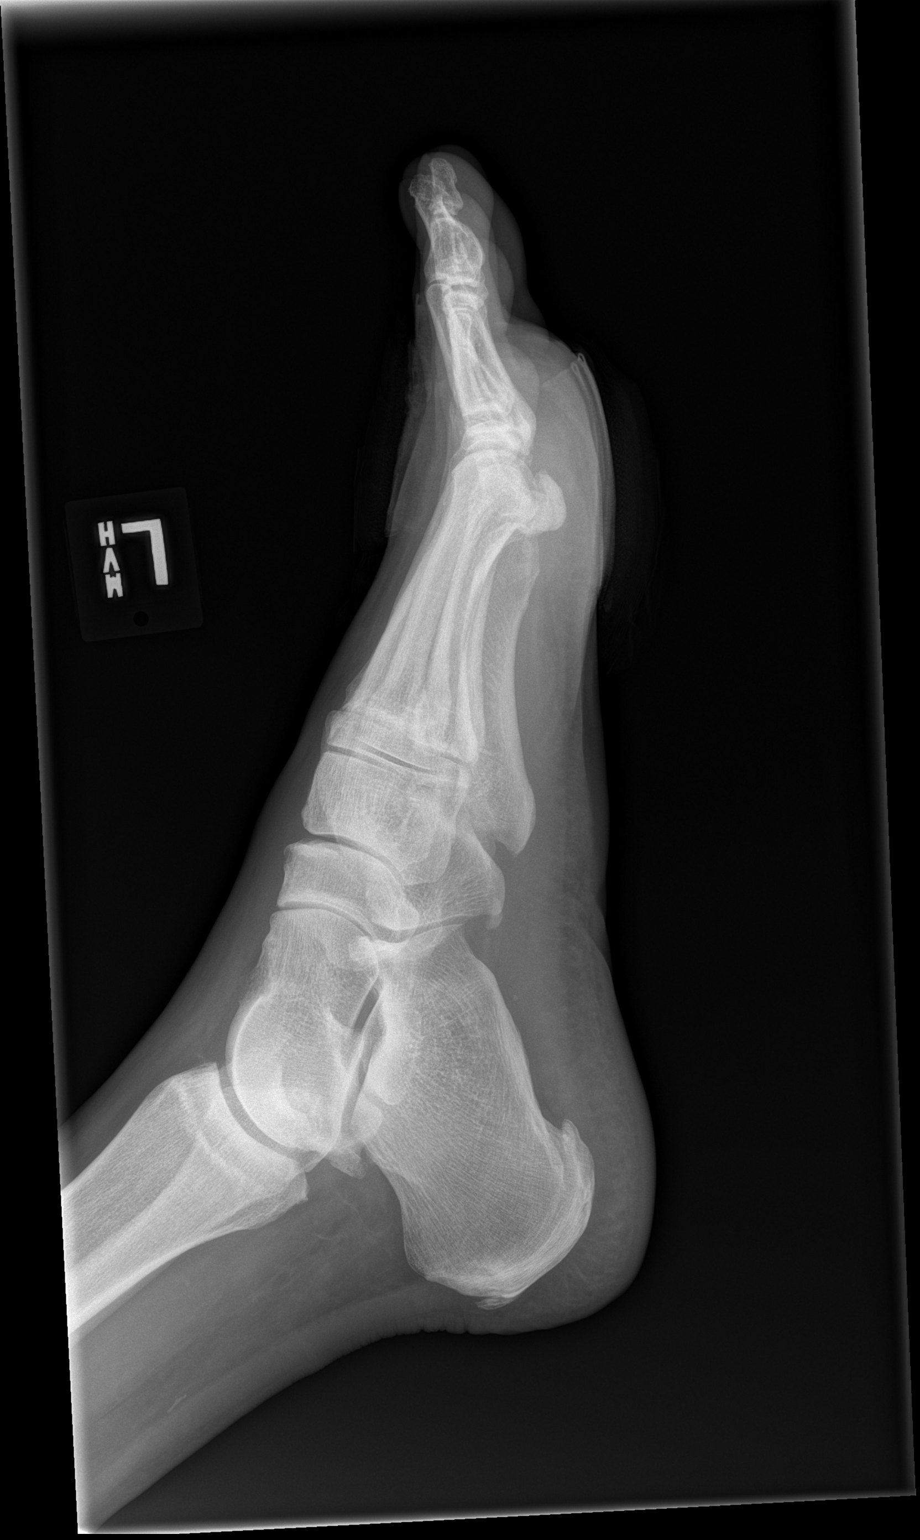

[3 of 3 positions shown; findings below may reference images not displayed]

FINDINGS: The metallic nail has been completely removed. There is mild
cortical destruction along the lateral aspect of the base of the
first proximal phalanx where the foreign body likely traversed.
There also is a slight lucent defect in the head of the second
metatarsal also potentially representing partial bony injury or
indentation. The metatarsal cortex in this region does appear to be
completely intact, however with no obvious break. No other bony
abnormalities.
IMPRESSION: Complete removal of metallic nail from the left foot. Probable focal
cortical destruction along the lateral aspect of the base of the
first proximal phalanx and slight lucent defect in the head of the
second metatarsal related to the course of the nail.

## 2020-03-30 IMAGING — CR DG FOOT COMPLETE 3+V*L*
3 series · 3 of 3 positions shown · non-contrast
Comparison: None.

CLINICAL DATA: Nail gun injury

EXAM:
LEFT FOOT - COMPLETE 3+ VIEW

[x foot ap left]
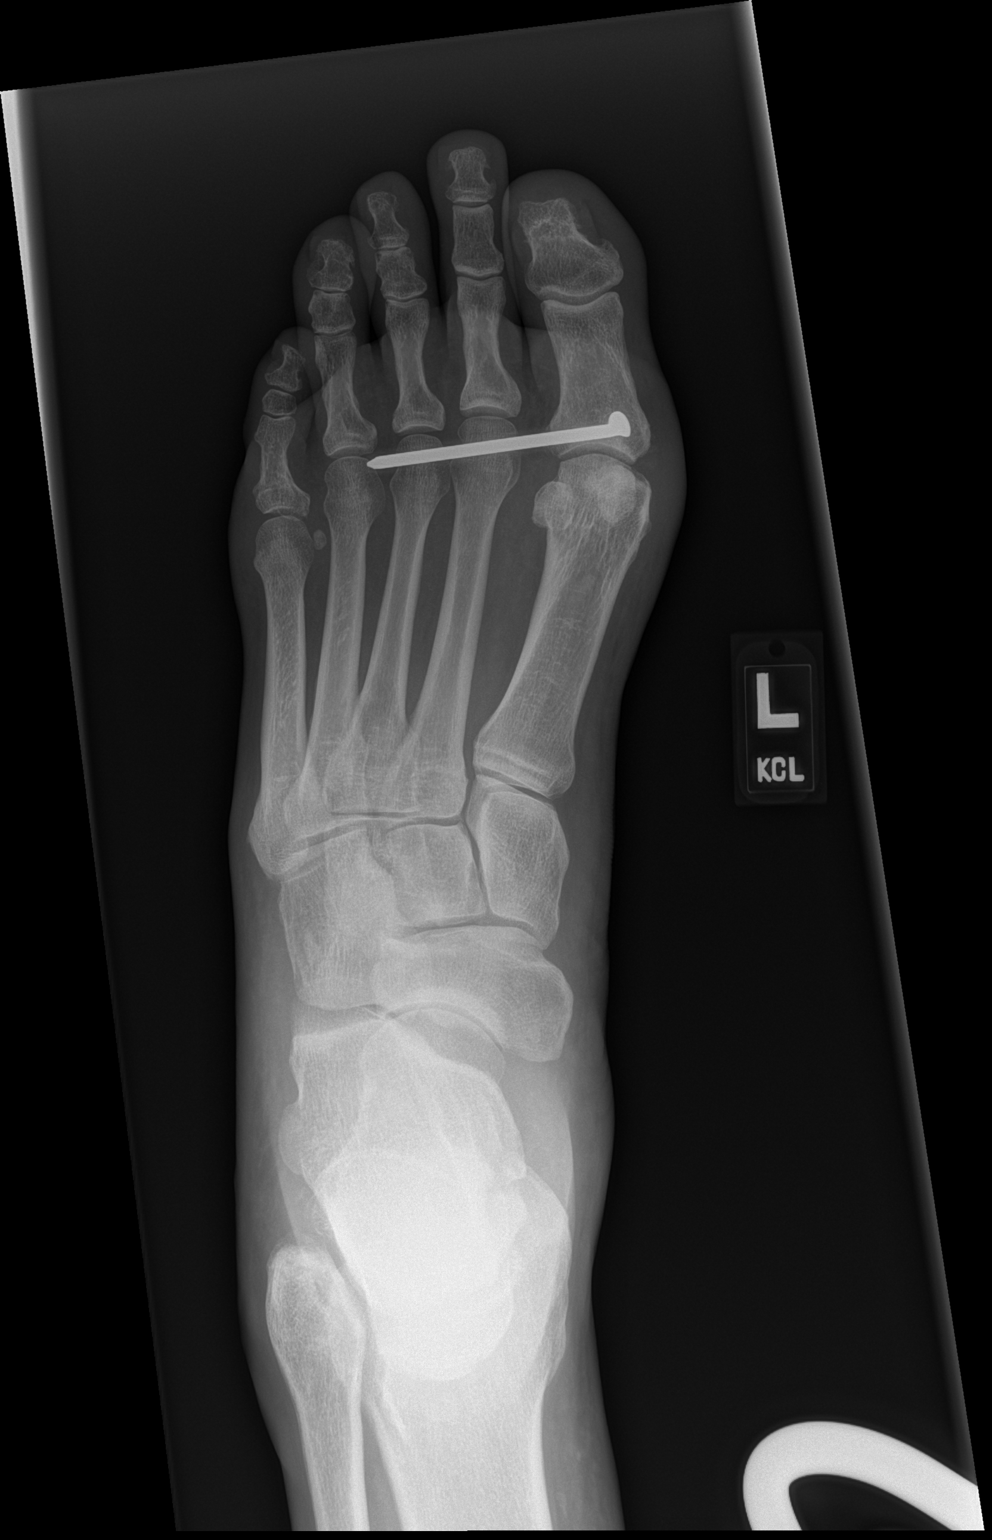

[x foot obl left]
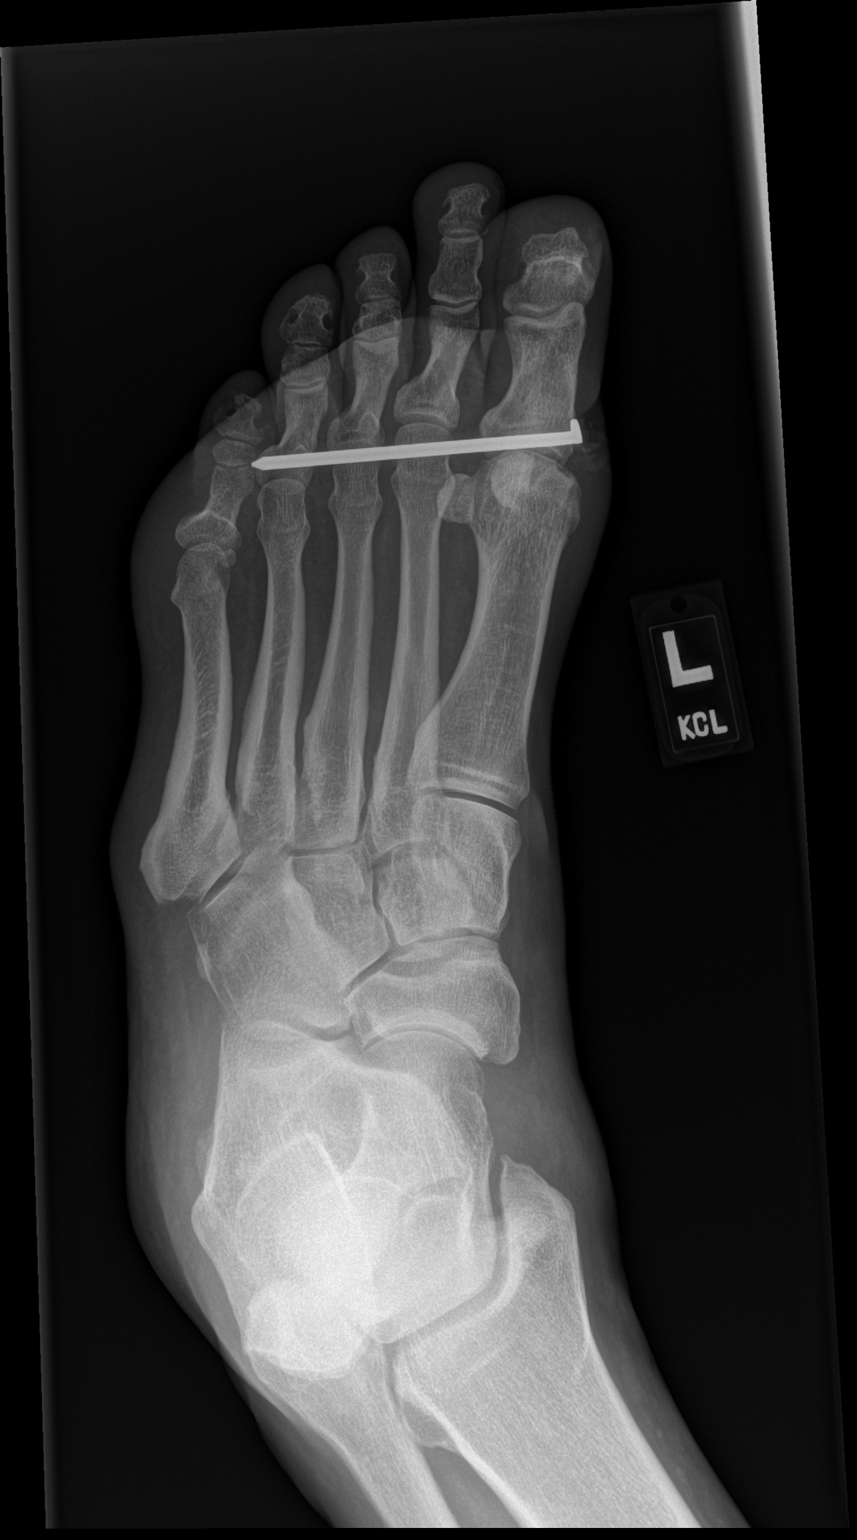

[x foot lat left]
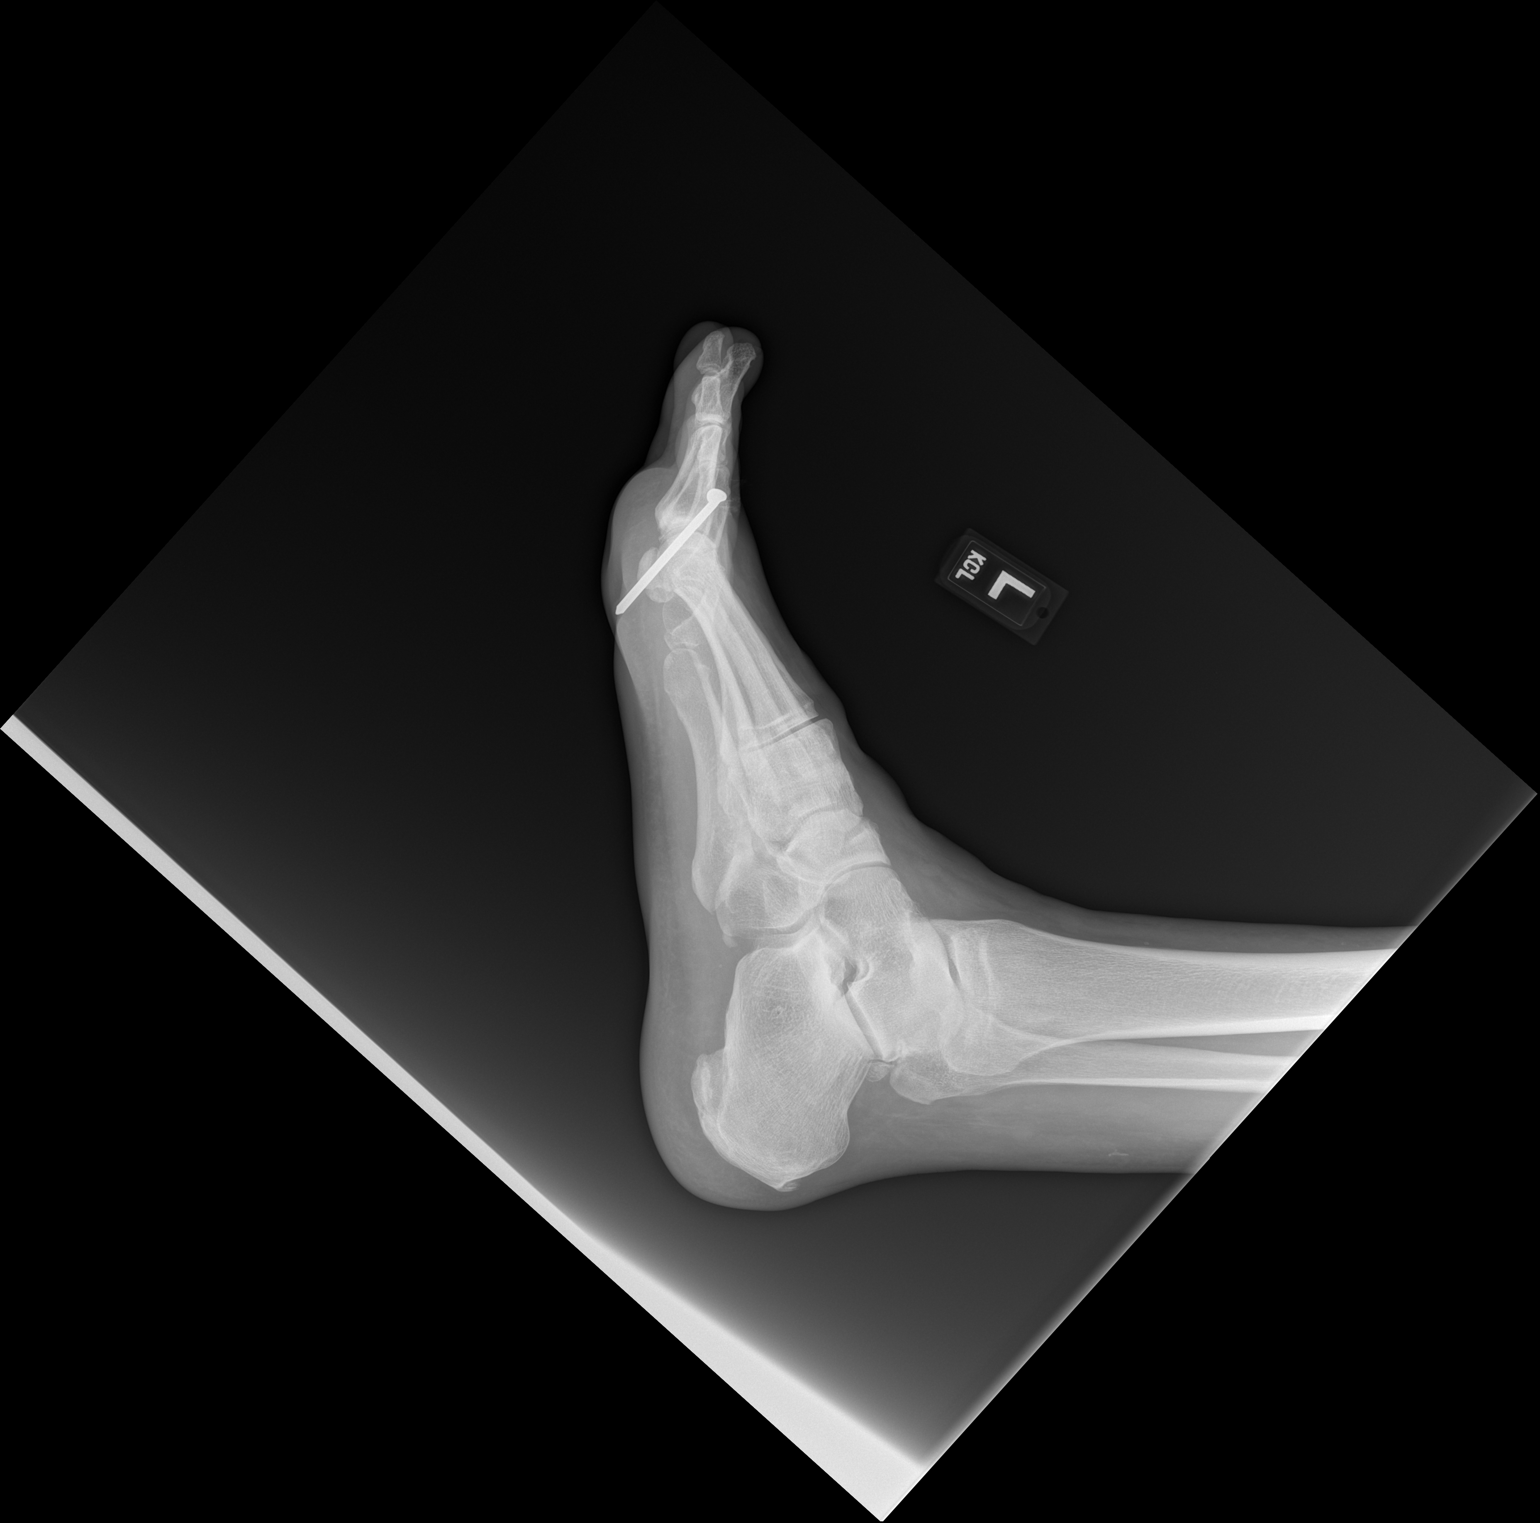

[3 of 3 positions shown; findings below may reference images not displayed]

FINDINGS: Metal nasal is present overlying the first second and third and
fourth metatarsophalangeal joints. This goes through the first
proximal phalanx and probably into the soft tissues inferior to the
second third and fourth MTP joint. No displaced fracture fragments.

Mild bunion and degenerative change first metatarsophalangeal joint.
IMPRESSION: Metal nail approximately 6 cm long appears to be passing through the
proximal first phalanx and extending into the soft tissues below the
second third and fourth MTP.
